# Patient Record
Sex: Male | Born: 1995 | Race: White | Hispanic: No | Smoking: Current every day smoker
Health system: Southern US, Community
[De-identification: ages and names within clinical notes are randomized; demographics above are authoritative.]

## PROBLEM LIST (undated history)

## (undated) HISTORY — PX: WISDOM TOOTH EXTRACTION: SHX21

---
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
## 2016-05-27 ENCOUNTER — Emergency Department
Admission: EM | Admit: 2016-05-27 | Discharge: 2016-05-27 | Disposition: A | Discharge status: Home / Self Care | Payer: BLUE CROSS/BLUE SHIELD | Attending: Emergency Medicine | Admitting: Emergency Medicine

## 2016-05-27 ENCOUNTER — Emergency Department: Payer: BLUE CROSS/BLUE SHIELD

## 2016-05-27 DIAGNOSIS — Y929 Unspecified place or not applicable: Secondary | ICD-10-CM | POA: Diagnosis not present

## 2016-05-27 DIAGNOSIS — Y999 Unspecified external cause status: Secondary | ICD-10-CM | POA: Insufficient documentation

## 2016-05-27 DIAGNOSIS — S838X1A Sprain of other specified parts of right knee, initial encounter: Secondary | ICD-10-CM

## 2016-05-27 DIAGNOSIS — W1839XA Other fall on same level, initial encounter: Secondary | ICD-10-CM | POA: Diagnosis not present

## 2016-05-27 DIAGNOSIS — S83206A Unspecified tear of unspecified meniscus, current injury, right knee, initial encounter: Secondary | ICD-10-CM | POA: Diagnosis not present

## 2016-05-27 DIAGNOSIS — Y9302 Activity, running: Secondary | ICD-10-CM | POA: Diagnosis not present

## 2016-05-27 DIAGNOSIS — F17200 Nicotine dependence, unspecified, uncomplicated: Secondary | ICD-10-CM | POA: Insufficient documentation

## 2016-05-27 DIAGNOSIS — S8991XA Unspecified injury of right lower leg, initial encounter: Secondary | ICD-10-CM | POA: Diagnosis present

## 2016-05-27 NOTE — ED Triage Notes (Signed)
Patient c/o bilateral knee pain. Pt reports he knees locked up and then he fell to the ground.

## 2016-05-27 NOTE — ED Notes (Signed)
Pt reports he was running chasing his dog when he felt a pop in both knees and fell. Pt reports he landed on his right shoulder but it is not bothering him. Pt co pain to bilateral kneed. CMS intact with limited ROMtdue to increasing pain with movement.

## 2016-05-27 NOTE — ED Provider Notes (Signed)
[facility] Emergency Department Provider Note  ____________________________________________  Time seen: Approximately 9:03 PM  I have reviewed the triage vital signs and the nursing notes.   HISTORY  Chief Complaint Knee Injury    HPI Lawrence Stewart is a 21 y.o. male that presents to the emergency room with bilateral knee pain that is worse on the right. Patient states that he was chasing his dog when he heard a pop, which caused him to fall to the ground. Patient states that he fell on right shoulder but is not having any pain in that shoulder. Patient states that he is able to bear weight on his left leg but has a lot of pain with weightbearing on right leg.He has not been able to walk on leg since incident. Pain is primarily with extension of right leg. Patient is not having any difficulty moving ankle or toes. He has never had a knee injury before. Patient denies fever, shortness of breath, chest pain, nausea, vomiting, abdominal pain, numbness, tingling.   History reviewed. No pertinent past medical history.  There are no active problems to display for this patient.   Past Surgical History:  Procedure Laterality Date  . WISDOM TOOTH EXTRACTION      Prior to Admission medications   Not on File    Allergies Patient has no known allergies.  History reviewed. No pertinent family history.  Social History Social History  Substance Use Topics  . Smoking status: Current Every Day Smoker    Packs/day: 1.00  . Smokeless tobacco: Never Used  . Alcohol use Yes     Comment: occassional     Review of Systems  Cardiovascular: No chest pain. Respiratory:  No SOB. Gastrointestinal: No abdominal pain.  No nausea, no vomiting.  Musculoskeletal: Positive for knee pain. Skin: Negative for rash, abrasions, lacerations, ecchymosis. Neurological: Negative for headaches, numbness or tingling   ____________________________________________   PHYSICAL  EXAM:  VITAL SIGNS: ED Triage Vitals [05/27/16 1936]  Enc Vitals Group     BP 133/67     Pulse Rate 80     Resp 17     Temp 98.8 F (37.1 C)     Temp Source Oral     SpO2 98 %     Weight 155 lb (70.3 kg)     Height 5\' 11"  (1.803 m)     Head Circumference      Peak Flow      Pain Score 4     Pain Loc      Pain Edu?      Excl. in GC?      Constitutional: Alert and oriented. Well appearing and in no acute distress. Eyes: Conjunctivae are normal. PERRL. EOMI. Head: Atraumatic. ENT:      Ears:      Nose: No congestion/rhinnorhea.      Mouth/Throat: Mucous membranes are moist.  Neck: No stridor.   Cardiovascular: Normal rate, regular rhythm.  Good peripheral circulation. 2+ dorsalis pedis and posterior tibialis pulses. Respiratory: Normal respiratory effort without tachypnea or retractions. Lungs CTAB. Good air entry to the bases with no decreased or absent breath sounds. Musculoskeletal: Full range of motion to all extremities. No gross deformities appreciated. No tenderness to palpation. No effusion noted. Positive Apley grind and McMurray of right knee. Negative anterior drawer, posterior drawer, valgus, varus, patella apprehension. Neurologic:  Normal speech and language. No gross focal neurologic deficits are appreciated.  Skin:  Skin is warm, dry and intact. No rash noted.  ____________________________________________   LABS (all labs ordered are listed, but only abnormal results are displayed)  Labs Reviewed - No data to display ____________________________________________  EKG   ____________________________________________  RADIOLOGY Lexine Baton, personally viewed and evaluated these images (plain radiographs) as part of my medical decision making, as well as reviewing the written report by the radiologist.  Dg Knee Complete 4 Views Left  Result Date: 05/27/2016 CLINICAL DATA:  Fall during running, bilateral knee pain EXAM: LEFT KNEE - COMPLETE 4+ VIEW  COMPARISON:  None. FINDINGS: Four views of the left knee submitted. No acute fracture or subluxation. No radiopaque foreign body. No joint effusion. No periosteal reaction or bony erosion. IMPRESSION: Negative. Electronically Signed   By: Natasha Mead M.D.   On: 05/27/2016 20:24   Dg Knee Complete 4 Views Right  Result Date: 05/27/2016 CLINICAL DATA:  Pt reports he was running chasing his dog when he felt a pop in both knees and fell. Pt reports he landed on his right shoulder but it is not bothering him. Pt co pain to bilateral kneed. CMS intact with limited ROMtdue to increasing pain with movement. MOST OF HIS PAIN IS BELOW HIS KNEE. EXAM: RIGHT KNEE - COMPLETE 4+ VIEW COMPARISON:  None. FINDINGS: Four views of the right knee submitted. No acute fracture or subluxation. No radiopaque foreign body. No joint effusion. No periosteal reaction or bony erosion. IMPRESSION: Negative. Electronically Signed   By: Natasha Mead M.D.   On: 05/27/2016 20:25    ____________________________________________    PROCEDURES  Procedure(s) performed:    Procedures    Medications - No data to display   ____________________________________________   INITIAL IMPRESSION / ASSESSMENT AND PLAN / ED COURSE  Pertinent labs & imaging results that were available during my care of the patient were reviewed by me and considered in my medical decision making (see chart for details).  Review of the [place] CSRS was performed in accordance of the [facility] prior to dispensing any controlled drugs.     Patient's diagnosis is consistent with meniscal injury. Vital signs and exam are reassuring. No acute bony abnormalities indicated on x-ray. Patient has positive Apley grind and McMurray test on right knee. Knee was Ace wrapped and crutches were given. Patient is to follow up with PCP as directed. Patient is given ED precautions to return to the ED for any worsening or new  symptoms.   ____________________________________________  FINAL CLINICAL IMPRESSION(S) / ED DIAGNOSES  Final diagnoses:  Injury of meniscus of right knee, initial encounter      NEW MEDICATIONS STARTED DURING THIS VISIT:  There are no discharge medications for this patient.       This chart was dictated using voice recognition software/Dragon. Despite best efforts to proofread, errors can occur which can change the meaning. Any change was purely unintentional.    Enid Derry, PA-C 05/27/16 1610    Merrily Brittle, MD 05/28/16 [phone]

## 2017-11-25 IMAGING — DX DG KNEE COMPLETE 4+V*R*
4 series · 4 of 4 positions shown · non-contrast
Comparison: None.

CLINICAL DATA: Pt reports he was running chasing his dog when he
felt a pop in both knees and fell. Pt reports he landed on his right
shoulder but it is not bothering him. Pt co pain to bilateral kneed.
CMS intact with limited ROMtdue to increasing pain with movement.
MOST OF HIS PAIN IS BELOW HIS KNEE.

EXAM:
RIGHT KNEE - COMPLETE 4+ VIEW

[knee ap]
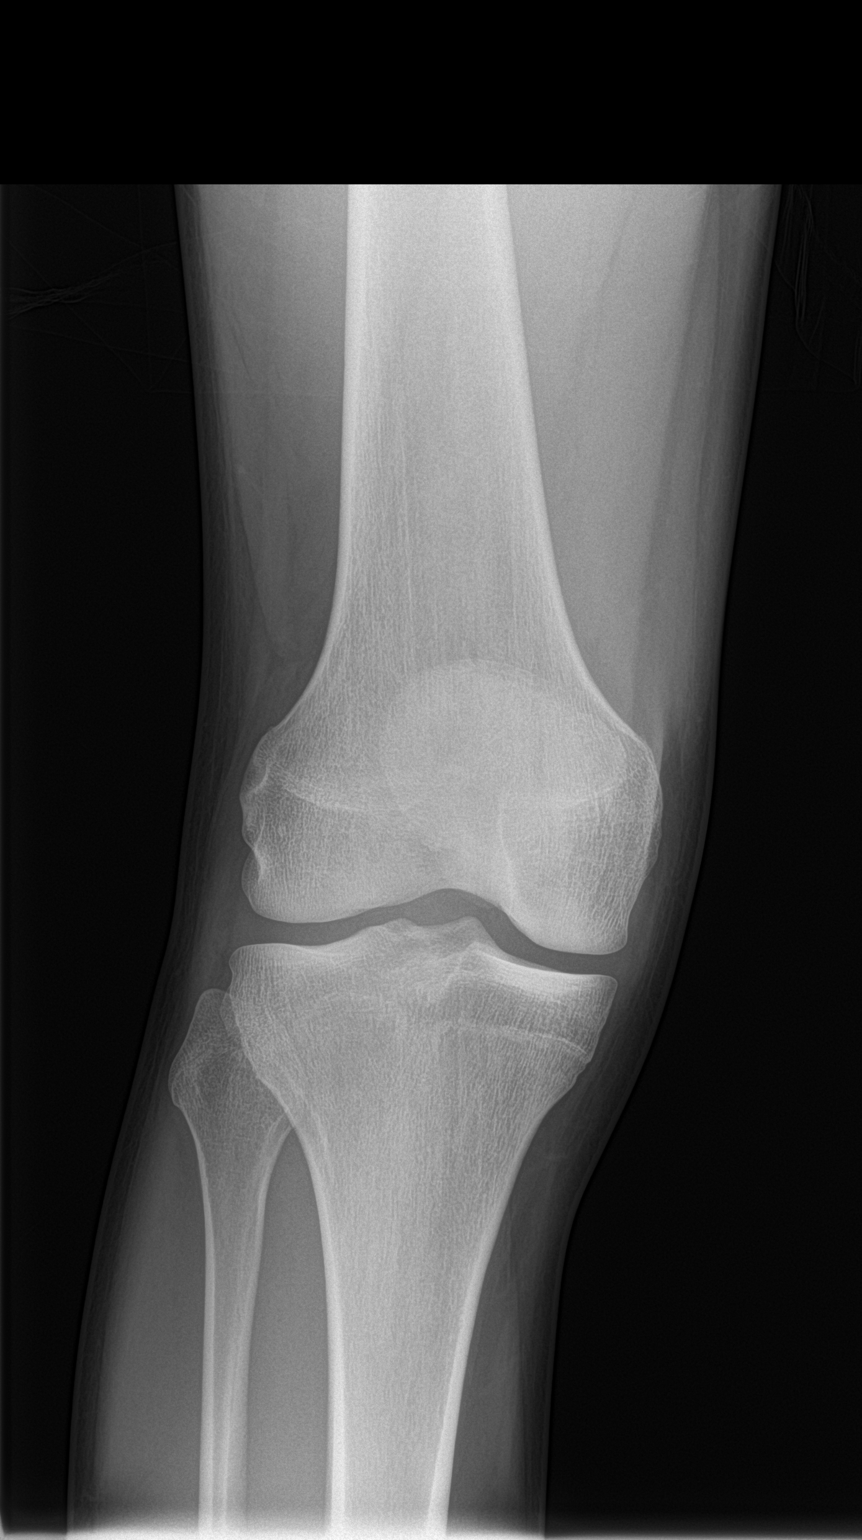

[knee obl (1 of 2)]
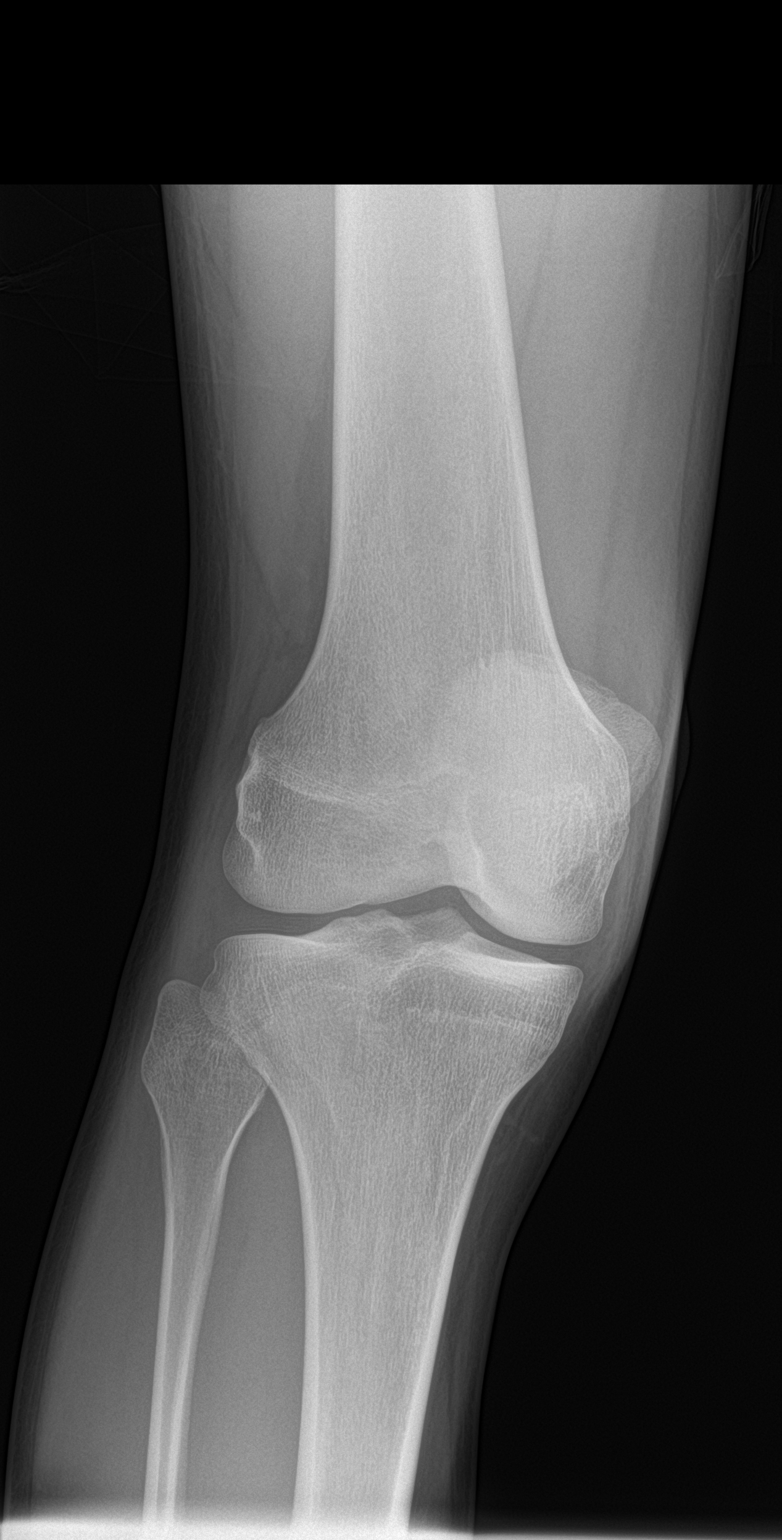

[knee obl (2 of 2)]
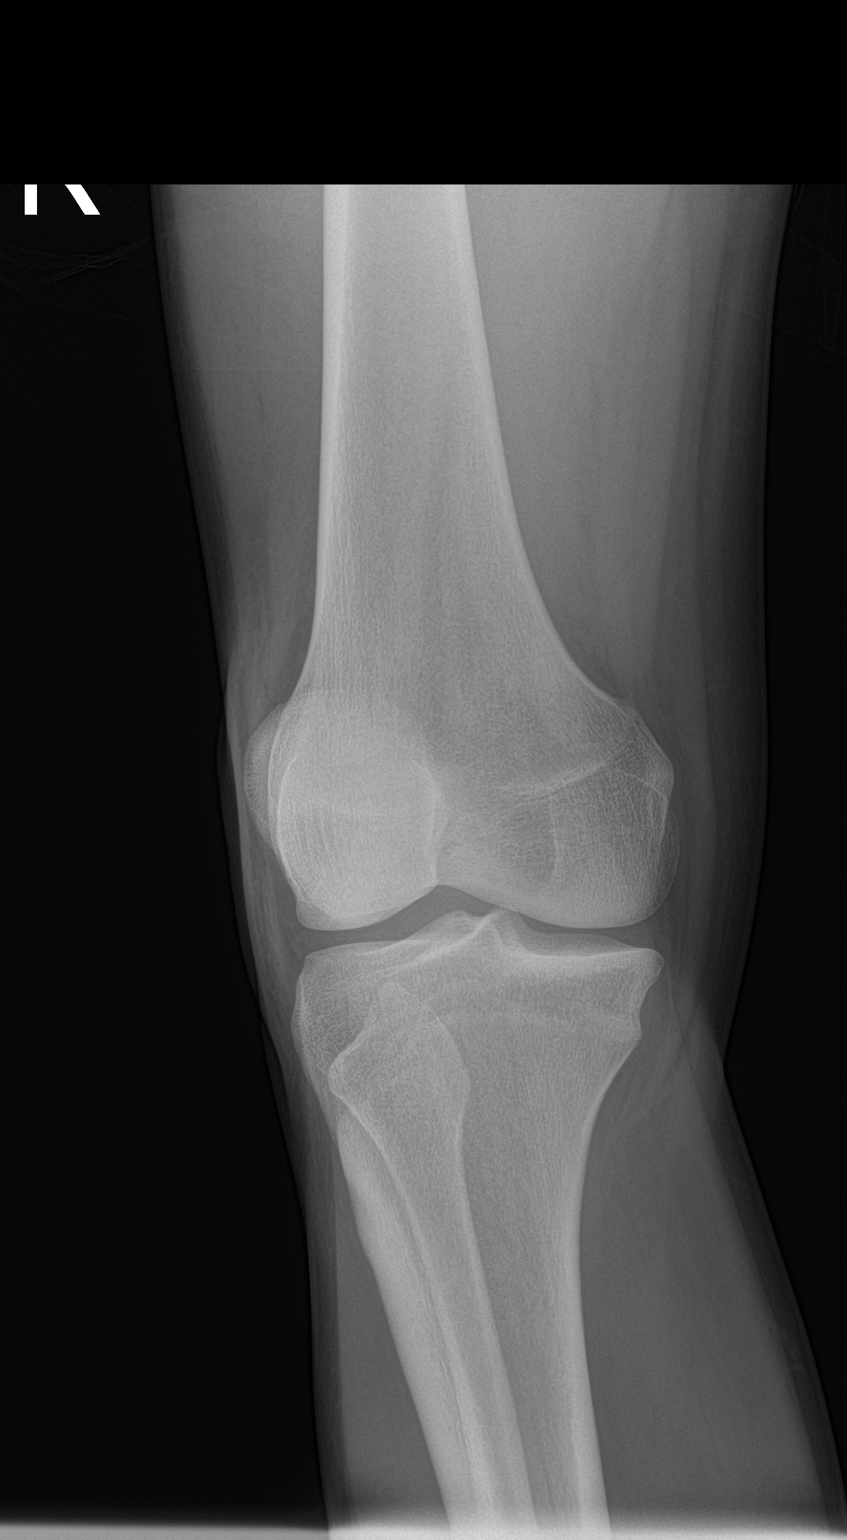

[knee lat]
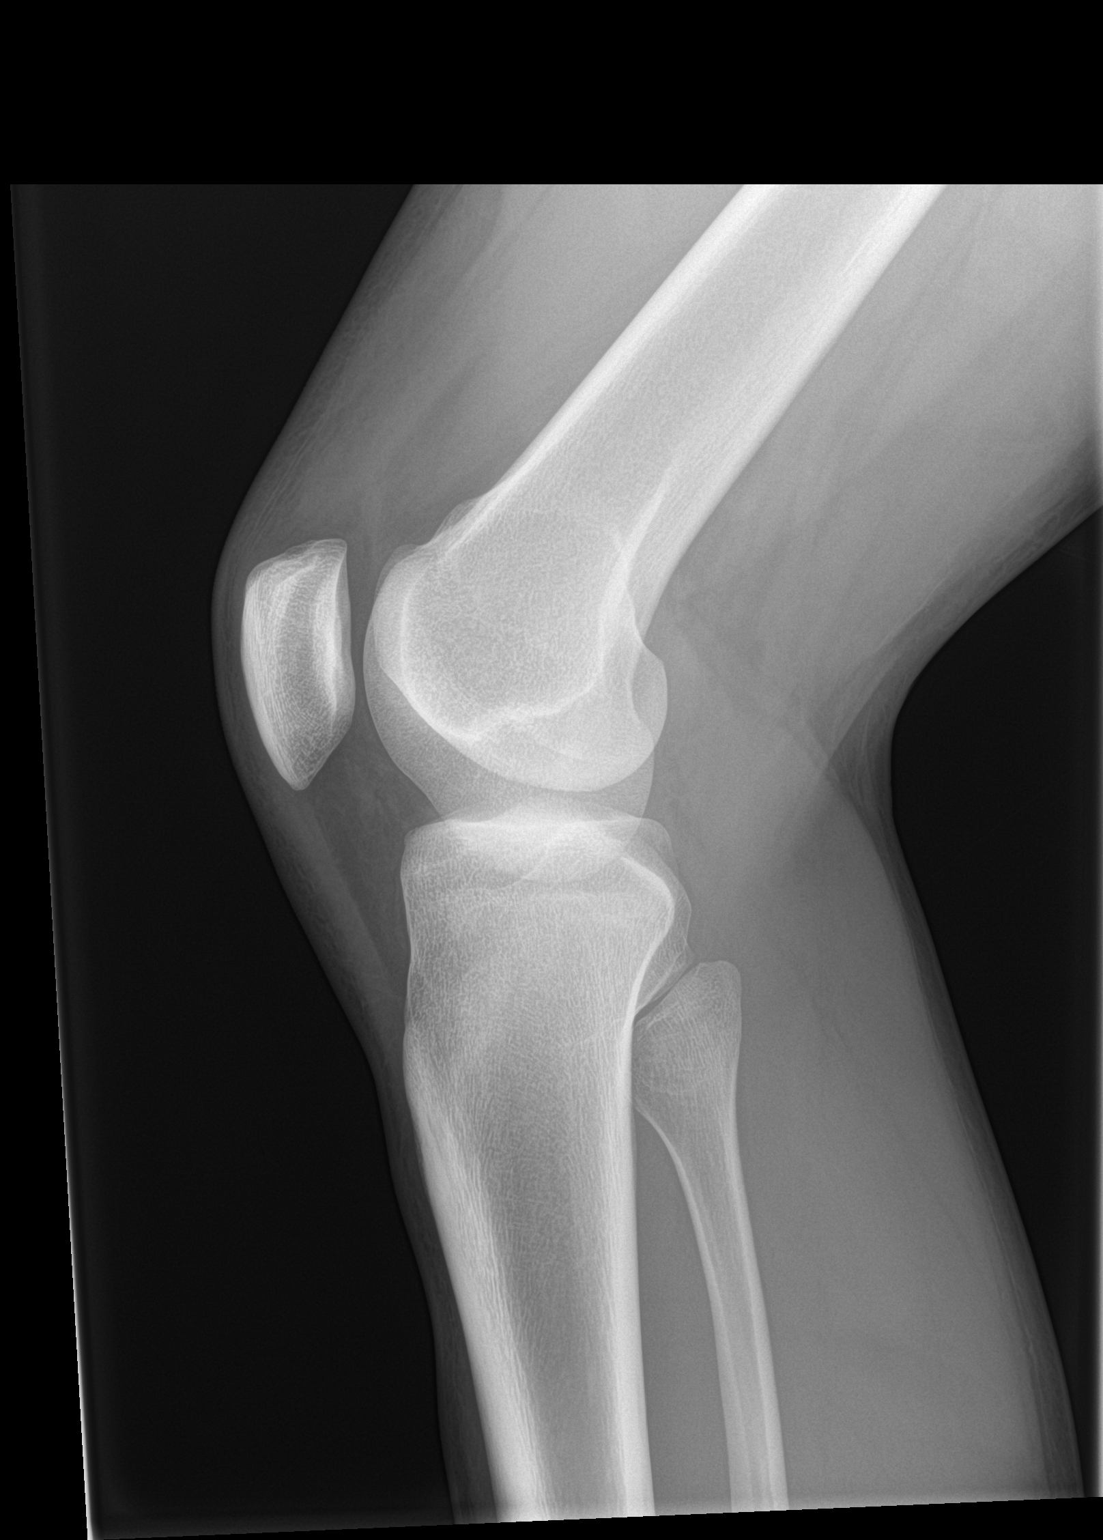

[4 of 4 positions shown; findings below may reference images not displayed]

FINDINGS: Four views of the right knee submitted. No acute fracture or
subluxation. No radiopaque foreign body. No joint effusion. No
periosteal reaction or bony erosion.
IMPRESSION: Negative.

## 2019-04-23 ENCOUNTER — Other Ambulatory Visit: Payer: Self-pay

## 2019-04-23 ENCOUNTER — Emergency Department
Admission: EM | Admit: 2019-04-23 | Discharge: 2019-04-23 | Disposition: A | Discharge status: Home / Self Care | Payer: BLUE CROSS/BLUE SHIELD | Attending: Emergency Medicine | Admitting: Emergency Medicine

## 2019-04-23 ENCOUNTER — Encounter: Payer: Self-pay | Admitting: Emergency Medicine

## 2019-04-23 ENCOUNTER — Emergency Department: Payer: BLUE CROSS/BLUE SHIELD

## 2019-04-23 DIAGNOSIS — Z20822 Contact with and (suspected) exposure to covid-19: Secondary | ICD-10-CM | POA: Diagnosis not present

## 2019-04-23 DIAGNOSIS — R112 Nausea with vomiting, unspecified: Secondary | ICD-10-CM | POA: Diagnosis not present

## 2019-04-23 DIAGNOSIS — F17200 Nicotine dependence, unspecified, uncomplicated: Secondary | ICD-10-CM | POA: Diagnosis not present

## 2019-04-23 DIAGNOSIS — B349 Viral infection, unspecified: Secondary | ICD-10-CM

## 2019-04-23 LAB — COMPREHENSIVE METABOLIC PANEL
ALT: 30 U/L (ref 0–44)
AST: 32 U/L (ref 15–41)
Albumin: 4.8 g/dL (ref 3.5–5.0)
Alkaline Phosphatase: 56 U/L (ref 38–126)
Anion gap: 9 (ref 5–15)
BUN: 21 mg/dL — ABNORMAL HIGH (ref 6–20)
CO2: 26 mmol/L (ref 22–32)
Calcium: 9.7 mg/dL (ref 8.9–10.3)
Chloride: 105 mmol/L (ref 98–111)
Creatinine, Ser: 0.88 mg/dL (ref 0.61–1.24)
GFR calc Af Amer: >60 mL/min (ref >60)
GFR calc non Af Amer: >60 mL/min (ref >60)
Glucose, Bld: 110 mg/dL — ABNORMAL HIGH (ref 70–99)
Potassium: 3.7 mmol/L (ref 3.5–5.1)
Sodium: 140 mmol/L (ref 135–145)
Total Bilirubin: 1.1 mg/dL (ref 0.3–1.2)
Total Protein: 8.1 g/dL (ref 6.5–8.1)

## 2019-04-23 LAB — CBC
HCT: 48.9 % (ref 39.0–52.0)
Hemoglobin: 16.3 g/dL (ref 13.0–17.0)
MCH: 29.5 pg (ref 26.0–34.0)
MCHC: 33.3 g/dL (ref 30.0–36.0)
MCV: 88.6 fL (ref 80.0–100.0)
Platelets: 214 10*3/uL (ref 150–400)
RBC: 5.52 MIL/uL (ref 4.22–5.81)
RDW: 13.1 % (ref 11.5–15.5)
WBC: 16.8 10*3/uL — ABNORMAL HIGH (ref 4.0–10.5)
nRBC: 0 % (ref 0.0–0.2)

## 2019-04-23 LAB — POC SARS CORONAVIRUS 2 AG: SARS Coronavirus 2 Ag: NEGATIVE

## 2019-04-23 MED ORDER — PANTOPRAZOLE SODIUM 40 MG PO TBEC
40.0000 mg | DELAYED_RELEASE_TABLET | Freq: Every day | ORAL | 1 refills | Status: AC
Start: 2019-04-23 — End: 2020-04-22

## 2019-04-23 MED ORDER — ONDANSETRON 4 MG PO TBDP: 4.0000 mg | ORAL_TABLET | Freq: Three times a day (TID) | ORAL | 0 refills | Status: DC | PRN

## 2019-04-23 MED ORDER — SODIUM CHLORIDE 0.9 % IV BOLUS
1000.0000 mL | Freq: Once | INTRAVENOUS | Status: AC
  Administered 2019-04-23: 1000 mL via INTRAVENOUS

## 2019-04-23 MED ORDER — ONDANSETRON HCL 4 MG/2ML IJ SOLN
4.0000 mg | Freq: Once | INTRAMUSCULAR | Status: AC
  Administered 2019-04-23: 4 mg via INTRAVENOUS
  Filled 2019-04-23: qty 2

## 2019-04-23 NOTE — ED Triage Notes (Signed)
Pt arrived via POV stating "I'm sick" pt reports vomiting several times. No distress noted at this time.

## 2019-04-23 NOTE — ED Provider Notes (Signed)
[facility] Emergency Department Provider Note  Time seen: 7:17 AM  I have reviewed the triage vital signs and the nursing notes.   HISTORY  Chief Complaint Emesis   HPI Lawrence Stewart is a 24 y.o. male with no past medical history presents to the emergency department for cough nausea vomiting.  According to the patient for many months now he has been nauseated first thing in the morning often times will vomit in the morning and then feels better.  However he states over the last 3 days he has been experiencing cough as well as nausea and vomiting.  Denies any sick contacts.  Denies any known fever.  Denies any abdominal pain.   History reviewed. No pertinent past medical history.  There are no problems to display for this patient.   Past Surgical History:  Procedure Laterality Date  . WISDOM TOOTH EXTRACTION      Prior to Admission medications   Not on File    No Known Allergies  History reviewed. No pertinent family history.  Social History Social History   Tobacco Use  . Smoking status: Current Every Day Smoker    Packs/day: 1.00  . Smokeless tobacco: Never Used  Substance Use Topics  . Alcohol use: Yes    Comment: occassional  . Drug use: No    Review of Systems Constitutional: Negative for fever. Cardiovascular: Negative for chest pain. Respiratory: Negative for shortness of breath.  Positive for cough. Gastrointestinal: Negative for abdominal pain.  Nausea vomiting mostly in the mornings. Genitourinary: Negative for urinary compaints Musculoskeletal: Negative for musculoskeletal complaints Neurological: Negative for headache All other ROS negative  ____________________________________________   PHYSICAL EXAM:  VITAL SIGNS: ED Triage Vitals  Enc Vitals Group     BP 04/23/19 0657 130/69     Pulse Rate 04/23/19 0657 87     Resp 04/23/19 0657 18     Temp 04/23/19 0657 97.7 F (36.5 C)     Temp Source 04/23/19 0657 Oral    SpO2 04/23/19 0657 98 %     Weight 04/23/19 0654 150 lb (68 kg)     Height 04/23/19 0654 5\' 11"  (1.803 m)     Head Circumference --      Peak Flow --      Pain Score 04/23/19 0654 0     Pain Loc --      Pain Edu? --      Excl. in GC? --    Constitutional: Alert and oriented. Well appearing and in no distress. Eyes: Normal exam ENT      Head: Normocephalic and atraumatic.      Mouth/Throat: Mucous membranes are moist. Cardiovascular: Normal rate, regular rhythm.  Respiratory: Normal respiratory effort without tachypnea nor retractions. Breath sounds are clear Gastrointestinal: Soft and nontender. No distention. Musculoskeletal: Nontender with normal range of motion in all extremities.  Neurologic:  Normal speech and language. No gross focal neurologic deficits  Skin:  Skin is warm, dry and intact.  Psychiatric: Mood and affect are normal.  ____________________________________________   RADIOLOGY  X-ray negative  ____________________________________________   INITIAL IMPRESSION / ASSESSMENT AND PLAN / ED COURSE  Pertinent labs & imaging results that were available during my care of the patient were reviewed by me and considered in my medical decision making (see chart for details).   Patient presents emergency department for nausea vomiting which he states has been an ongoing issue for months mostly in the morning then he feels better, more acutely  over the past 3 days he has been experiencing cough as well as increased nausea vomiting.  Denies any fever.  Reassuring vitals.  Reassuring physical exam.  Patient does have an occasional cough during exam but clear lung sounds.  We will check labs, treat with Zofran, IV fluids.  We will obtain a chest x-ray and a rapid Covid swab.  Patient agreeable to plan of care.  X-rays negative.  Labs are largely reassuring besides a mild leukocytosis.  Reassuring vitals reassuring physical exam.  We will discharge on Protonix as well as  Zofran.  I discussed Maalox use before bedtime.  Patient will be discharged with PCP follow-up as needed.  Discussed return precautions.  Lawrence Stewart was evaluated in Emergency Department on 04/23/2019 for the symptoms described in the history of present illness. He was evaluated in the context of the global COVID-19 pandemic, which necessitated consideration that the patient might be at risk for infection with the SARS-CoV-2 virus that causes COVID-19. Institutional protocols and algorithms that pertain to the evaluation of patients at risk for COVID-19 are in a state of rapid change based on information released by regulatory bodies including the CDC and federal and state organizations. These policies and algorithms were followed during the patient's care in the ED.  ____________________________________________   FINAL CLINICAL IMPRESSION(S) / ED DIAGNOSES  Nausea vomiting   Harvest Dark, MD 04/23/19 [phone]

## 2020-10-21 IMAGING — DX DG CHEST 1V PORT
1 series · 1 of 1 positions shown · non-contrast
Comparison: October 12, 2010

CLINICAL DATA: Cough

EXAM:
PORTABLE CHEST 1 VIEW

[chest ap]
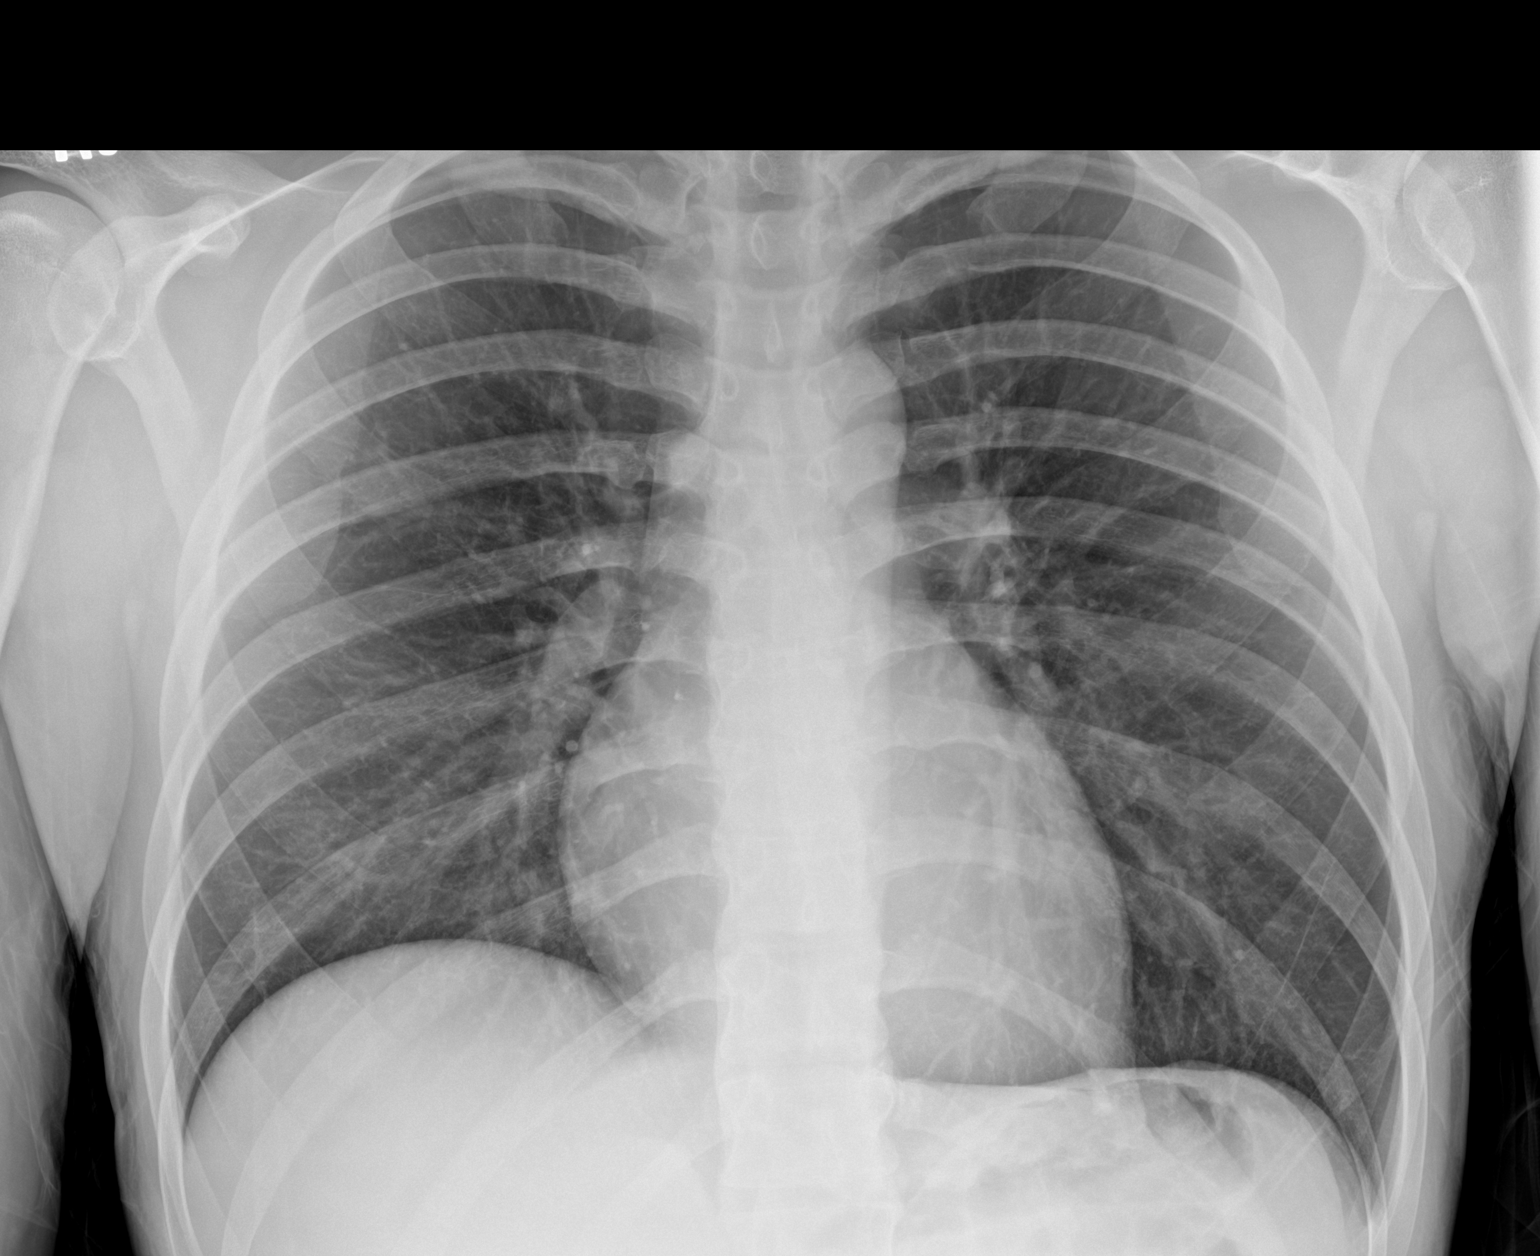

[1 of 1 positions shown; findings below may reference images not displayed]

FINDINGS: Lungs are clear. Heart size and pulmonary vascularity are normal. No
adenopathy. No bone lesions.
IMPRESSION: No abnormality noted.

## 2021-07-27 ENCOUNTER — Emergency Department (HOSPITAL_BASED_OUTPATIENT_CLINIC_OR_DEPARTMENT_OTHER)
Admission: EM | Admit: 2021-07-27 | Discharge: 2021-07-27 | Disposition: A | Discharge status: Home / Self Care | Payer: BLUE CROSS/BLUE SHIELD | Attending: Emergency Medicine | Admitting: Emergency Medicine

## 2021-07-27 ENCOUNTER — Other Ambulatory Visit: Payer: Self-pay

## 2021-07-27 ENCOUNTER — Encounter (HOSPITAL_BASED_OUTPATIENT_CLINIC_OR_DEPARTMENT_OTHER): Payer: Self-pay

## 2021-07-27 ENCOUNTER — Other Ambulatory Visit (HOSPITAL_BASED_OUTPATIENT_CLINIC_OR_DEPARTMENT_OTHER): Payer: Self-pay

## 2021-07-27 DIAGNOSIS — R112 Nausea with vomiting, unspecified: Secondary | ICD-10-CM

## 2021-07-27 DIAGNOSIS — K922 Gastrointestinal hemorrhage, unspecified: Secondary | ICD-10-CM | POA: Insufficient documentation

## 2021-07-27 LAB — COMPREHENSIVE METABOLIC PANEL
ALT: 18 U/L (ref 0–44)
AST: 17 U/L (ref 15–41)
Albumin: 4.6 g/dL (ref 3.5–5.0)
Alkaline Phosphatase: 42 U/L (ref 38–126)
Anion gap: 13 (ref 5–15)
BUN: 24 mg/dL — ABNORMAL HIGH (ref 6–20)
CO2: 23 mmol/L (ref 22–32)
Calcium: 9.3 mg/dL (ref 8.9–10.3)
Chloride: 100 mmol/L (ref 98–111)
Creatinine, Ser: 0.8 mg/dL (ref 0.61–1.24)
GFR, Estimated: >60 mL/min (ref >60)
Glucose, Bld: 87 mg/dL (ref 70–99)
Potassium: 3.6 mmol/L (ref 3.5–5.1)
Sodium: 136 mmol/L (ref 135–145)
Total Bilirubin: 0.5 mg/dL (ref 0.3–1.2)
Total Protein: 7.4 g/dL (ref 6.5–8.1)

## 2021-07-27 LAB — URINALYSIS, ROUTINE W REFLEX MICROSCOPIC
Bilirubin Urine: NEGATIVE
Glucose, UA: NEGATIVE mg/dL
Ketones, ur: >80 mg/dL — AB
Leukocytes,Ua: NEGATIVE
Nitrite: NEGATIVE
Protein, ur: 100 mg/dL — AB
Specific Gravity, Urine: 1.036 — ABNORMAL HIGH (ref 1.005–1.030)
pH: 6 (ref 5.0–8.0)

## 2021-07-27 LAB — CBC
HCT: 47.9 % (ref 39.0–52.0)
Hemoglobin: 16.2 g/dL (ref 13.0–17.0)
MCH: 29.5 pg (ref 26.0–34.0)
MCHC: 33.8 g/dL (ref 30.0–36.0)
MCV: 87.1 fL (ref 80.0–100.0)
Platelets: 150 10*3/uL (ref 150–400)
RBC: 5.5 MIL/uL (ref 4.22–5.81)
RDW: 13.1 % (ref 11.5–15.5)
WBC: 5.8 10*3/uL (ref 4.0–10.5)
nRBC: 0 % (ref 0.0–0.2)

## 2021-07-27 LAB — LIPASE, BLOOD: Lipase: 24 U/L (ref 11–51)

## 2021-07-27 MED ORDER — PANTOPRAZOLE SODIUM 40 MG IV SOLR
40.0000 mg | Freq: Once | INTRAVENOUS | Status: AC
  Administered 2021-07-27: 40 mg via INTRAVENOUS
  Filled 2021-07-27: qty 10

## 2021-07-27 MED ORDER — ONDANSETRON 4 MG PO TBDP
4.0000 mg | ORAL_TABLET | Freq: Three times a day (TID) | ORAL | 0 refills | Status: AC | PRN
  Filled 2021-07-27: qty 20, 7d supply, fill #0

## 2021-07-27 MED ORDER — OMEPRAZOLE 20 MG PO CPDR
20.0000 mg | DELAYED_RELEASE_CAPSULE | Freq: Every day | ORAL | 0 refills | Status: AC
  Filled 2021-07-27: qty 30, 30d supply, fill #0

## 2021-07-27 MED ORDER — SODIUM CHLORIDE 0.9 % IV BOLUS
1000.0000 mL | Freq: Once | INTRAVENOUS | Status: AC
  Administered 2021-07-27: 1000 mL via INTRAVENOUS

## 2021-07-27 NOTE — Discharge Instructions (Signed)
Do not take anymore aspirin or ibuprofen/Motrin or Aleve.  Tylenol would be okay to take as needed.  Take the medications as written today.  Follow-up with gastroenterologist by calling to make an appointment this week.  Return back to the ER if your symptoms worsen if you have fevers heavy bleeding or any additional concerns return immediately back to the ER.

## 2021-07-27 NOTE — ED Triage Notes (Signed)
He reports nausea, lack of appetite x 4 days. He states he vomited x 2 "and the second time had a little blood in it" [sic]. He knows two other people with similar s/s.

## 2021-07-27 NOTE — ED Provider Notes (Signed)
[facility] EMERGENCY DEPT Provider Note   CSN: [id number] Arrival date & time: 07/27/21  1028     History  Chief Complaint  Patient presents with   Emesis    Lawrence Stewart is a 26 y.o. male.  Patient presents to ER chief complaint of nausea vomiting x4 days.  He states he had contact with a friend who had similar symptoms about 4 days ago.  Otherwise denies any headache denies any diarrhea.  He is noticed streaks of blood in his vomitus yesterday and today.  Denies any recent travel or recent antibiotic use.  He took Zofran from a friend today.  Patient also states that for the past 3 days he has been taking ibuprofen for body aches.      Home Medications Prior to Admission medications   Medication Sig Start Date End Date Taking? Authorizing Provider  omeprazole (PRILOSEC) 20 MG capsule Take 1 capsule (20 mg total) by mouth daily. 07/27/21 08/26/21 Yes Cheryll Cockayne, MD  ondansetron (ZOFRAN-ODT) 4 MG disintegrating tablet Take 1 tablet (4 mg total) by mouth every 8 (eight) hours as needed for nausea or vomiting. 07/27/21  Yes Cheryll Cockayne, MD  pantoprazole (PROTONIX) 40 MG tablet Take 1 tablet (40 mg total) by mouth daily. 04/23/19 04/22/20  Minna Antis, MD      Allergies    Patient has no known allergies.    Review of Systems   Review of Systems  Constitutional:  Negative for fever.  HENT:  Negative for ear pain and sore throat.   Eyes:  Negative for pain.  Respiratory:  Negative for cough.   Cardiovascular:  Negative for chest pain.  Gastrointestinal:  Negative for abdominal pain.  Genitourinary:  Negative for flank pain.  Musculoskeletal:  Negative for back pain.  Skin:  Negative for color change and rash.  Neurological:  Negative for syncope.  All other systems reviewed and are negative.  Physical Exam Updated Vital Signs BP 119/65   Pulse (!) 49   Temp 97.9 F (36.6 C) (Oral)   Resp 18   Ht 5\' 10"  (1.778 m)   Wt 72.6 kg   SpO2 100%    BMI 22.96 kg/m  Physical Exam Constitutional:      Appearance: He is well-developed.  HENT:     Head: Normocephalic.     Nose: Nose normal.  Eyes:     Extraocular Movements: Extraocular movements intact.  Cardiovascular:     Rate and Rhythm: Normal rate.  Pulmonary:     Effort: Pulmonary effort is normal.  Abdominal:     Tenderness: There is no abdominal tenderness. There is no guarding or rebound.  Skin:    Coloration: Skin is not jaundiced.  Neurological:     General: No focal deficit present.     Mental Status: He is alert and oriented to person, place, and time. Mental status is at baseline.    ED Results / Procedures / Treatments   Labs (all labs ordered are listed, but only abnormal results are displayed) Labs Reviewed  COMPREHENSIVE METABOLIC PANEL - Abnormal; Notable for the following components:      Result Value   BUN 24 (*)    All other components within normal limits  URINALYSIS, ROUTINE W REFLEX MICROSCOPIC - Abnormal; Notable for the following components:   Specific Gravity, Urine 1.036 (*)    Hgb urine dipstick MODERATE (*)    Ketones, ur >80 (*)    Protein, ur 100 (*)  All other components within normal limits  LIPASE, BLOOD  CBC    EKG None  Radiology No results found.  Procedures Procedures    Medications Ordered in ED Medications  pantoprazole (PROTONIX) injection 40 mg (40 mg Intravenous Given 07/27/21 1139)  sodium chloride 0.9 % bolus 1,000 mL (0 mLs Intravenous Stopped 07/27/21 1250)    ED Course/ Medical Decision Making/ A&P                           Medical Decision Making Amount and/or Complexity of Data Reviewed Labs: ordered.  Risk Prescription drug management.   Chart review shows episode of nausea vomiting February 2021.  Cardiac monitoring shows sinus rhythm.  Diagnostic studies included labs.  Patient had normal chemistry normal white count normal hemoglobin of 16.  Given liter bolus of IV fluids and IV  Protonix.  Will prescribe omeprazole to go home with.  Recommending outpatient follow-up with GI.  Advised patient to call tomorrow to schedule an appointment.  Recommend immediate return for worsening symptoms or any additional concerns.  Advised him not to take any more ibuprofen or aspirin or Aleve.        Final Clinical Impression(s) / ED Diagnoses Final diagnoses:  Nausea and vomiting, unspecified vomiting type  Upper GI bleed    Rx / DC Orders ED Discharge Orders          Ordered    omeprazole (PRILOSEC) 20 MG capsule  Daily        07/27/21 1310    ondansetron (ZOFRAN-ODT) 4 MG disintegrating tablet  Every 8 hours PRN        07/27/21 1310              Cheryll Cockayne, MD 07/27/21 1311

## 2023-06-19 ENCOUNTER — Other Ambulatory Visit: Payer: Self-pay

## 2023-06-19 ENCOUNTER — Emergency Department
Admission: EM | Admit: 2023-06-19 | Discharge: 2023-06-19 | Disposition: A | Discharge status: Home / Self Care | Payer: Self-pay | Attending: Emergency Medicine | Admitting: Emergency Medicine

## 2023-06-19 ENCOUNTER — Emergency Department: Payer: Self-pay

## 2023-06-19 DIAGNOSIS — R197 Diarrhea, unspecified: Secondary | ICD-10-CM | POA: Diagnosis not present

## 2023-06-19 DIAGNOSIS — R059 Cough, unspecified: Secondary | ICD-10-CM | POA: Insufficient documentation

## 2023-06-19 DIAGNOSIS — R112 Nausea with vomiting, unspecified: Secondary | ICD-10-CM | POA: Diagnosis present

## 2023-06-19 LAB — COMPREHENSIVE METABOLIC PANEL WITH GFR
ALT: 26 U/L (ref 0–44)
AST: 22 U/L (ref 15–41)
Albumin: 5 g/dL (ref 3.5–5.0)
Alkaline Phosphatase: 46 U/L (ref 38–126)
Anion gap: 12 (ref 5–15)
BUN: 37 mg/dL — ABNORMAL HIGH (ref 6–20)
CO2: 21 mmol/L — ABNORMAL LOW (ref 22–32)
Calcium: 9.8 mg/dL (ref 8.9–10.3)
Chloride: 100 mmol/L (ref 98–111)
Creatinine, Ser: 0.97 mg/dL (ref 0.61–1.24)
GFR, Estimated: >60 mL/min (ref >60)
Glucose, Bld: 101 mg/dL — ABNORMAL HIGH (ref 70–99)
Potassium: 3.9 mmol/L (ref 3.5–5.1)
Sodium: 133 mmol/L — ABNORMAL LOW (ref 135–145)
Total Bilirubin: 0.6 mg/dL (ref 0.0–1.2)
Total Protein: 8.1 g/dL (ref 6.5–8.1)

## 2023-06-19 LAB — CBC
HCT: 51.3 % (ref 39.0–52.0)
Hemoglobin: 17.4 g/dL — ABNORMAL HIGH (ref 13.0–17.0)
MCH: 29.7 pg (ref 26.0–34.0)
MCHC: 33.9 g/dL (ref 30.0–36.0)
MCV: 87.7 fL (ref 80.0–100.0)
Platelets: 232 10*3/uL (ref 150–400)
RBC: 5.85 MIL/uL — ABNORMAL HIGH (ref 4.22–5.81)
RDW: 12.5 % (ref 11.5–15.5)
WBC: 9.9 10*3/uL (ref 4.0–10.5)
nRBC: 0 % (ref 0.0–0.2)

## 2023-06-19 LAB — RESP PANEL BY RT-PCR (RSV, FLU A&B, COVID)  RVPGX2
Influenza A by PCR: NEGATIVE
Influenza B by PCR: NEGATIVE
Resp Syncytial Virus by PCR: NEGATIVE
SARS Coronavirus 2 by RT PCR: NEGATIVE

## 2023-06-19 LAB — LIPASE, BLOOD: Lipase: 33 U/L (ref 11–51)

## 2023-06-19 MED ORDER — ONDANSETRON HCL 4 MG/2ML IJ SOLN
4.0000 mg | Freq: Once | INTRAMUSCULAR | Status: AC
  Administered 2023-06-19: 4 mg via INTRAVENOUS
  Filled 2023-06-19: qty 2

## 2023-06-19 MED ORDER — SODIUM CHLORIDE 0.9 % IV BOLUS
1000.0000 mL | Freq: Once | INTRAVENOUS | Status: AC
  Administered 2023-06-19: 1000 mL via INTRAVENOUS

## 2023-06-19 MED ORDER — ONDANSETRON HCL 4 MG PO TABS: 4.0000 mg | ORAL_TABLET | Freq: Three times a day (TID) | ORAL | 0 refills | Status: AC | PRN

## 2023-06-19 MED ORDER — DROPERIDOL 2.5 MG/ML IJ SOLN
1.2500 mg | Freq: Once | INTRAMUSCULAR | Status: AC
  Administered 2023-06-19: 1.25 mg via INTRAVENOUS
  Filled 2023-06-19: qty 2

## 2023-06-19 NOTE — ED Notes (Signed)
Pt given sprite to drink. 

## 2023-06-19 NOTE — ED Provider Notes (Signed)
 [facility] Provider Note    Event Date/Time   First MD Initiated Contact with Patient 06/19/23 1136     (approximate)   History   Hematemesis   HPI Lawrence Stewart is a 28 y.o. male presented today for vomiting.  Patient states over the past 3 days he has had continuous nausea, vomiting, and diarrhea.  No specific abdominal pain.  Has had some mild congestion and cough as well.  Denies any specific chest pain or shortness of breath or dysuria.  He stated he had an episode today where he saw slight streaks of blood which she had not seen before.  Denies any frank blood in his vomit or in his stool.  No black stools.  No prior history of GI bleed.     Physical Exam   Triage Vital Signs: ED Triage Vitals [06/19/23 1123]  Encounter Vitals Group     BP 127/89     Systolic BP Percentile      Diastolic BP Percentile      Pulse Rate 66     Resp 20     Temp 98.1 F (36.7 C)     Temp Source Oral     SpO2 98 %     Weight      Height      Head Circumference      Peak Flow      Pain Score 0     Pain Loc      Pain Education      Exclude from Growth Chart     Most recent vital signs: Vitals:   06/19/23 1445 06/19/23 1511  BP: 126/65 (!) 134/98  Pulse: (!) 44 62  Resp: 20 18  Temp:    SpO2: 99% 99%   Physical Exam: I have reviewed the vital signs and nursing notes. General: Awake, alert, no acute distress.  Nontoxic appearing. Head:  Atraumatic, normocephalic.   ENT:  EOM intact, PERRL. Oral mucosa is pink and moist with no lesions. Neck: Neck is supple with full range of motion, No meningeal signs. Cardiovascular:  RRR, No murmurs. Peripheral pulses palpable and equal bilaterally. Respiratory:  Symmetrical chest wall expansion.  No rhonchi, rales, or wheezes.  Good air movement throughout.  No use of accessory muscles.   Musculoskeletal:  No cyanosis or edema. Moving extremities with full ROM Abdomen:  Soft, nontender, nondistended. Neuro:  GCS  15, moving all four extremities, interacting appropriately. Speech clear. Psych:  Calm, appropriate.   Skin:  Warm, dry, no rash.    ED Results / Procedures / Treatments   Labs (all labs ordered are listed, but only abnormal results are displayed) Labs Reviewed  COMPREHENSIVE METABOLIC PANEL WITH GFR - Abnormal; Notable for the following components:      Result Value   Sodium 133 (*)    CO2 21 (*)    Glucose, Bld 101 (*)    BUN 37 (*)    All other components within normal limits  CBC - Abnormal; Notable for the following components:   RBC 5.85 (*)    Hemoglobin 17.4 (*)    All other components within normal limits  RESP PANEL BY RT-PCR (RSV, FLU A&B, COVID)  RVPGX2  LIPASE, BLOOD     EKG    RADIOLOGY Independently interpreted chest x-ray with no acute pathology   PROCEDURES:  Critical Care performed: No  Procedures   MEDICATIONS ORDERED IN ED: Medications  ondansetron (ZOFRAN) injection 4 mg (4 mg Intravenous Given  06/19/23 1250)  sodium chloride 0.9 % bolus 1,000 mL (0 mLs Intravenous Stopped 06/19/23 1348)  droperidol (INAPSINE) 2.5 MG/ML injection 1.25 mg (1.25 mg Intravenous Given 06/19/23 1412)     IMPRESSION / MDM / ASSESSMENT AND PLAN / ED COURSE  I reviewed the triage vital signs and the nursing notes.                              Differential diagnosis includes, but is not limited to, viral gastroenteritis, Mallory-Weiss tear, viral URI, COVID, flu, RSV, dehydration  Patient's presentation is most consistent with acute complicated illness / injury requiring diagnostic workup.  Patient is a 28 year old male presenting today for nausea, vomiting, and diarrhea.  Had 1 episode of some streaks of blood in his vomit but no frank blood in the vomit.  Given 3 days of vomiting, suspect small microtears consistent with a Mallory-Weiss injury.  Although, no frank blood loss.  He is well-appearing on exam with no abdominal tenderness likely indicating more viral GI  infection versus gastroenteritis versus colitis.  Will treat with nausea medication and fluids.  CBC reassuring.  Hemoglobin slightly elevated likely indicating some hemoconcentration.  Lipase and CMP otherwise reassuring outside of signs of dehydration.  Chest x-ray shows no acute pathology per my interpretation.  Significant delay in radiology reads and I do not think he needs to wait any longer for that as he has no tachypnea, shortness of breath, hypoxia on room air.  Negative for COVID, flu, RSV.  Was feeling better following nausea medication and fluids.  Will discharge with Zofran and given strict return precautions for any worsening abdominal pain.  The patient is on the cardiac monitor to evaluate for evidence of arrhythmia and/or significant heart rate changes.     FINAL CLINICAL IMPRESSION(S) / ED DIAGNOSES   Final diagnoses:  Nausea vomiting and diarrhea     Rx / DC Orders   ED Discharge Orders          Ordered    ondansetron (ZOFRAN) 4 MG tablet  Every 8 hours PRN        06/19/23 1514             Note:  This document was prepared using Dragon voice recognition software and may include unintentional dictation errors.   Kandee Orion, MD 06/19/23 [phone]

## 2023-06-19 NOTE — ED Notes (Signed)
 Pt heart rate low when sleeping, once awake and talking HR increases into the 70's.

## 2023-06-19 NOTE — ED Notes (Signed)
 Pt stating he still feels nauseated after zofran. Pt only drinking a few sips from drink so far. Has not vomited since arrival to room.

## 2023-06-19 NOTE — ED Triage Notes (Signed)
 Pt to ED via POV from home. Pt reports has been sick for 3 days and this morning had streaks of blood in his vomit. Pt also reports diarrhea and abd pain. Pt denies etoh use or drug use. Pt reports has not urinated in 3 days

## 2023-06-19 NOTE — ED Notes (Signed)
 Pt asleep upon entering room, pt reconnected to monitor.

## 2023-06-19 NOTE — ED Notes (Signed)
 Pt stating still feeling nauseated. Last time pt vomitted was about an hour ago but states has not had anything to drink today so not much is coming out. Pt endorses small amount of bright red blood in emesis. Denies any pain at this time.

## 2023-06-19 NOTE — Discharge Instructions (Signed)
 I have sent nausea medication to your pharmacy.  Please follow-up with your primary care provider as needed.  Please return for any severe worsening symptoms.  Hydration is the biggest key at this time.

## 2023-06-30 ENCOUNTER — Encounter (HOSPITAL_BASED_OUTPATIENT_CLINIC_OR_DEPARTMENT_OTHER): Payer: Self-pay

## 2023-06-30 ENCOUNTER — Other Ambulatory Visit: Payer: Self-pay

## 2023-06-30 DIAGNOSIS — J029 Acute pharyngitis, unspecified: Secondary | ICD-10-CM | POA: Diagnosis present

## 2023-06-30 DIAGNOSIS — J36 Peritonsillar abscess: Secondary | ICD-10-CM | POA: Insufficient documentation

## 2023-06-30 MED ORDER — SODIUM CHLORIDE 0.9 % IV SOLN
3.0000 g | Freq: Once | INTRAVENOUS | Status: AC
  Administered 2023-07-01: 3 g via INTRAVENOUS

## 2023-06-30 MED ORDER — DEXAMETHASONE SODIUM PHOSPHATE 10 MG/ML IJ SOLN
10.0000 mg | Freq: Once | INTRAMUSCULAR | Status: AC
  Administered 2023-07-01: 10 mg via INTRAVENOUS
  Filled 2023-06-30: qty 1

## 2023-06-30 NOTE — ED Triage Notes (Signed)
 Pt presents via POV c/o sore throat and "swollen throat" x2 days. Report worse today. Reports painful swallowing.

## 2023-07-01 ENCOUNTER — Emergency Department (HOSPITAL_BASED_OUTPATIENT_CLINIC_OR_DEPARTMENT_OTHER): Payer: Self-pay

## 2023-07-01 ENCOUNTER — Emergency Department (HOSPITAL_BASED_OUTPATIENT_CLINIC_OR_DEPARTMENT_OTHER)
Admission: EM | Admit: 2023-07-01 | Discharge: 2023-07-01 | Disposition: A | Discharge status: Home / Self Care | Payer: Self-pay | Attending: Emergency Medicine | Admitting: Emergency Medicine

## 2023-07-01 DIAGNOSIS — J36 Peritonsillar abscess: Secondary | ICD-10-CM

## 2023-07-01 LAB — GROUP A STREP BY PCR: Group A Strep by PCR: NOT DETECTED

## 2023-07-01 MED ORDER — KETOROLAC TROMETHAMINE 15 MG/ML IJ SOLN
15.0000 mg | Freq: Once | INTRAMUSCULAR | Status: AC
Start: 2023-07-01 — End: 2023-07-01
  Administered 2023-07-01: 15 mg via INTRAVENOUS
  Filled 2023-07-01: qty 1

## 2023-07-01 MED ORDER — HYDROCODONE-ACETAMINOPHEN 7.5-325 MG/15ML PO SOLN: 15.0000 mL | Freq: Four times a day (QID) | ORAL | 0 refills | Status: AC | PRN

## 2023-07-01 MED ORDER — IOHEXOL 300 MG/ML  SOLN
100.0000 mL | Freq: Once | INTRAMUSCULAR | Status: AC | PRN
  Administered 2023-07-01: 75 mL via INTRAVENOUS

## 2023-07-01 MED ORDER — AMOXICILLIN-POT CLAVULANATE 400-57 MG/5ML PO SUSR: 875.0000 mg | Freq: Two times a day (BID) | ORAL | 0 refills | Status: AC

## 2023-07-01 MED ORDER — SODIUM CHLORIDE 0.9 % IV BOLUS
1000.0000 mL | Freq: Once | INTRAVENOUS | Status: AC
  Administered 2023-07-01: 1000 mL via INTRAVENOUS

## 2023-07-01 NOTE — ED Notes (Signed)
Pt tolerating sips of water at this time.

## 2023-07-01 NOTE — ED Provider Notes (Signed)
 [place] EMERGENCY DEPARTMENT AT [facility] Provider Note   CSN: [id number] Arrival date & time: 06/30/23  2330     History  Chief Complaint  Patient presents with   Sore Throat    Lawrence Stewart is a 28 y.o. male.  The history is provided by the patient.  Sore Throat This is a new problem. The current episode started 2 days ago. The problem has been gradually worsening. The symptoms are aggravated by swallowing. Nothing relieves the symptoms.   Patient reports onset of sore throat and feels swollen for the past 2 days.  Reports painful swallowing. No fevers or vomiting in the past 24 hours.  Did have a viral gastrointestinal illness over 10 days ago that resolved    Home Medications Prior to Admission medications   Medication Sig Start Date End Date Taking? Authorizing Provider  amoxicillin-clavulanate (AUGMENTIN) 400-57 MG/5ML suspension Take 10.9 mLs (875 mg total) by mouth 2 (two) times daily for 7 days. 07/01/23 07/08/23 Yes Eldon Greenland, MD  HYDROcodone-acetaminophen  (HYCET) 7.5-325 mg/15 ml solution Take 15 mLs by mouth every 6 (six) hours as needed for severe pain (pain score 7-10). 07/01/23 06/30/24 Yes Eldon Greenland, MD  omeprazole  (PRILOSEC) 20 MG capsule Take 1 capsule (20 mg total) by mouth daily. 07/27/21 08/26/21  Billie Budge, MD  ondansetron  (ZOFRAN ) 4 MG tablet Take 1 tablet (4 mg total) by mouth every 8 (eight) hours as needed. 06/19/23 06/18/24  Kandee Orion, MD  ondansetron  (ZOFRAN -ODT) 4 MG disintegrating tablet Take 1 tablet (4 mg total) by mouth every 8 (eight) hours as needed for nausea or vomiting. 07/27/21   Billie Budge, MD  pantoprazole  (PROTONIX ) 40 MG tablet Take 1 tablet (40 mg total) by mouth daily. 04/23/19 04/22/20  Paduchowski, Kevin, MD      Allergies    Patient has no known allergies.    Review of Systems   Review of Systems  Constitutional:  Negative for fever.  HENT:  Positive for sore throat.     Physical  Exam Updated Vital Signs BP 117/73   Pulse 95   Temp 98.7 F (37.1 C) (Oral)   Resp 16   SpO2 95%  Physical Exam CONSTITUTIONAL: Well developed/well nourished, no distress HEAD: Normocephalic/atraumatic EYES: EOMI/PERRL ENMT: Mucous membranes moist, uvula is deviated to the right.  Erythema and edema noted to the left tonsillar region.  No stridor, no drooling, no trismus.  No dysphonia NECK: supple no meningeal signs LUNGS: no apparent distress NEURO: Pt is awake/alert/appropriate, moves all extremitiesx4.  No facial droop.   EXTREMITIES:  full ROM SKIN: warm, color normal   ED Results / Procedures / Treatments   Labs (all labs ordered are listed, but only abnormal results are displayed) Labs Reviewed  GROUP A STREP BY PCR    EKG None  Radiology CT Soft Tissue Neck W Contrast Result Date: 07/01/2023 CLINICAL DATA:  Initial evaluation for acute sore throat, suspected epiglottitis or tonsillitis. EXAM: CT NECK WITH CONTRAST TECHNIQUE: Multidetector CT imaging of the neck was performed using the standard protocol following the bolus administration of intravenous contrast. RADIATION DOSE REDUCTION: This exam was performed according to the departmental dose-optimization program which includes automated exposure control, adjustment of the mA and/or kV according to patient size and/or use of iterative reconstruction technique. CONTRAST:  75mL OMNIPAQUE IOHEXOL 300 MG/ML  SOLN COMPARISON:  None Available. FINDINGS: Pharynx and larynx: Oral cavity within normal limits. Asymmetric enlargement of the left palatine tonsil, consistent with acute tonsillitis.  Superimposed rim enhancing hypodense collection measuring 1.6 x 2.2 x 1.2 cm, consistent with tonsillar/peritonsillar abscess (series 3, image 34). Associated hazy inflammatory stranding within the adjacent left parapharyngeal and submandibular spaces. Mucosal edema within the adjacent left pharynx, compatible with associated pharyngitis.  Epiglottis itself within normal limits. No retropharyngeal collection. Supraglottic airway remains patent. Negative glottis. Subglottic airway patent clear. Salivary glands: Salivary glands including the parotid and submandibular glands are within normal limits. Thyroid: Normal. Lymph nodes: Asymmetric prominence of left upper cervical lymph nodes, largest of which measures 8 mm in short axis at level 2, presumably reactive. Vascular: Normal intravascular enhancement seen within the neck. Limited intracranial: Unremarkable. Visualized orbits: Unremarkable. Mastoids and visualized paranasal sinuses: Mild mucosal thickening noted about the floor of the left maxillary sinus. Visualized paranasal sinuses are otherwise clear. Visualized mastoids and middle ear cavities are well pneumatized and free of fluid. Skeleton: No worrisome osseous lesions. Chronic anterior endplate wedging noted at the superior endplate of T1. Mild spondylosis present at C5-6. Upper chest: No other acute finding. Other: None. IMPRESSION: 1. Findings consistent with acute left-sided tonsillitis/pharyngitis, with superimposed 1.6 x 2.2 x 1.2 cm tonsillar/peritonsillar abscess. 2. Asymmetric prominence of left upper cervical lymph nodes, presumably reactive. Electronically Signed   By: Virgia Griffins M.D.   On: 07/01/2023 00:52    Procedures Procedures    Medications Ordered in ED Medications  sodium chloride  0.9 % bolus 1,000 mL (has no administration in time range)  dexamethasone (DECADRON) injection 10 mg (10 mg Intravenous Given 07/01/23 0016)  Ampicillin-Sulbactam (UNASYN) 3 g in sodium chloride  0.9 % 100 mL IVPB (0 g Intravenous Stopped 07/01/23 0127)  iohexol (OMNIPAQUE) 300 MG/ML solution 100 mL (75 mLs Intravenous Contrast Given 07/01/23 0030)  ketorolac (TORADOL) 15 MG/ML injection 15 mg (15 mg Intravenous Given 07/01/23 0127)    ED Course/ Medical Decision Making/ A&P Clinical Course as of 07/01/23 0220  Sun Jul 01, 2023  0006 I have a strong suspicion the patient has a left peritonsillar abscess IV antibiotics and Decadron have been ordered Plan for CT imaging [DW]  0118 Findings consistent with peritonsillar abscess on CT imaging However airway is widely patent.  On exam patient is awake and alert, no acute distress, no stridor or drooling. [DW]  0118 Discussed with Dr. Virgia Griffins with otolaryngology and plans for patient to follow-up as an outpatient in a little over 24 hours in their ENT clinic Patient is not requiring emergent incision and drainage at this time [DW]  0219 Overall patient is improved, handling his secretions during drinking fluids  Patient feels comfortable going home, as I did offer him transfer for emergent I&D but he defers at this time  We discussed strict return precautions [DW]    Clinical Course User Index [DW] Eldon Greenland, MD                                 Medical Decision Making Amount and/or Complexity of Data Reviewed Radiology: ordered.  Risk Prescription drug management.   This patient presents to the ED for concern of sore throat, this involves an extensive number of treatment options, and is a complaint that carries with it a high risk of complications and morbidity.  The differential diagnosis includes but is not limited to strep pharyngitis, viral pharyngitis, mononucleosis, retropharyngeal abscess, peritonsillar abscess, epiglottitis  Social Determinants of Health: Patient's lack of insurance  increases the complexity of managing their presentation  Additional history obtained: Additional history obtained from family  Imaging Studies ordered: I ordered imaging studies including CT scan soft tissue neck   I independently visualized and interpreted imaging which showed left peritonsillar abscess I agree with the radiologist interpretation  Medicines ordered and prescription drug management: I ordered medication including IV antibiotics and Decadron  for presumed peritonsillar abscess Reevaluation of the patient after these medicines showed that the patient    stayed the same   Critical Interventions:   IV antibiotics  Consultations Obtained: I requested consultation with the consultant otolaryngology , and discussed  findings as well as pertinent plan - they recommend: Follow-up in around 24 hours  Reevaluation: After the interventions noted above, I reevaluated the patient and found that they have :stayed the same  Complexity of problems addressed: Patient's presentation is most consistent with  acute presentation with potential threat to life or bodily function  Disposition: After consideration of the diagnostic results and the patient's response to treatment,  I feel that the patent would benefit from discharge   .           Final Clinical Impression(s) / ED Diagnoses Final diagnoses:  Peritonsillar abscess    Rx / DC Orders ED Discharge Orders          Ordered    amoxicillin-clavulanate (AUGMENTIN) 400-57 MG/5ML suspension  2 times daily        07/01/23 0118    HYDROcodone-acetaminophen  (HYCET) 7.5-325 mg/15 ml solution  Every 6 hours PRN        07/01/23 0118              Eldon Greenland, MD 07/01/23 1610

## 2023-07-02 ENCOUNTER — Emergency Department (HOSPITAL_BASED_OUTPATIENT_CLINIC_OR_DEPARTMENT_OTHER)
Admission: EM | Admit: 2023-07-02 | Discharge: 2023-07-02 | Disposition: A | Discharge status: Home / Self Care | Attending: Emergency Medicine | Admitting: Emergency Medicine

## 2023-07-02 ENCOUNTER — Encounter (HOSPITAL_BASED_OUTPATIENT_CLINIC_OR_DEPARTMENT_OTHER): Payer: Self-pay

## 2023-07-02 ENCOUNTER — Other Ambulatory Visit: Payer: Self-pay

## 2023-07-02 ENCOUNTER — Telehealth: Payer: Self-pay | Admitting: Otolaryngology

## 2023-07-02 DIAGNOSIS — J029 Acute pharyngitis, unspecified: Secondary | ICD-10-CM | POA: Diagnosis present

## 2023-07-02 DIAGNOSIS — J36 Peritonsillar abscess: Secondary | ICD-10-CM | POA: Insufficient documentation

## 2023-07-02 MED ORDER — MORPHINE SULFATE (PF) 4 MG/ML IV SOLN
4.0000 mg | Freq: Once | INTRAVENOUS | Status: AC
  Administered 2023-07-02: 4 mg via INTRAVENOUS
  Filled 2023-07-02: qty 1

## 2023-07-02 MED ORDER — LACTATED RINGERS IV BOLUS
1000.0000 mL | Freq: Once | INTRAVENOUS | Status: AC
  Administered 2023-07-02: 1000 mL via INTRAVENOUS

## 2023-07-02 MED ORDER — ONDANSETRON HCL 4 MG/2ML IJ SOLN
4.0000 mg | Freq: Once | INTRAMUSCULAR | Status: AC
  Administered 2023-07-02: 4 mg via INTRAVENOUS
  Filled 2023-07-02: qty 2

## 2023-07-02 MED ORDER — DEXAMETHASONE SODIUM PHOSPHATE 10 MG/ML IJ SOLN
6.0000 mg | Freq: Once | INTRAMUSCULAR | Status: AC
  Administered 2023-07-02: 6 mg via INTRAVENOUS
  Filled 2023-07-02: qty 1

## 2023-07-02 NOTE — ED Notes (Signed)
Dr. Floyd at bedside. 

## 2023-07-02 NOTE — Telephone Encounter (Signed)
 Patient called and left a VM to schedule an appointment to be seen for ED F/U for an abscess in throat, please call and schedule

## 2023-07-02 NOTE — ED Triage Notes (Signed)
 In for further eval of swollen and sore throat since Saturday. Currently taking antibiotics and was to follow up with ENT today but office is closed till noon due to construction.

## 2023-07-02 NOTE — ED Triage Notes (Addendum)
 Dr Virgia Griffins called and enroute!

## 2023-07-02 NOTE — ED Provider Notes (Signed)
 I assumed patient care on arrival from [facility].  Patient sent with PTA, plan for ENT eval.   Patient evaluated, muffled voice, unable to swallow secretions feels pain is well controlled.    Albertus Hughs, DO 07/02/23 1212

## 2023-07-02 NOTE — Consult Note (Signed)
 Reason for Consult:pta Referring Physician: Dr Ector Goltz Lawrence Stewart is an 28 y.o. male.  HPI: hx of 1-2 days of left sore throat. He is still able to swallow and has increased sore throat. He was seen in ER and CT scan shows left PTA. He has no previous hx.  History reviewed. No pertinent past medical history.  Past Surgical History:  Procedure Laterality Date   WISDOM TOOTH EXTRACTION      History reviewed. No pertinent family history.  Social History:  reports that he has been smoking cigarettes. He has never used smokeless tobacco. He reports current alcohol use. He reports that he does not use drugs.  Allergies: No Known Allergies  Medications: I have reviewed the patient's current medications.  Results for orders placed or performed during the hospital encounter of 07/01/23 (from the past 48 hours)  Group A Strep by PCR     Status: None   Collection Time: 06/30/23 11:43 PM   Specimen: Throat; Sterile Swab  Result Value Ref Range   Group A Strep by PCR NOT DETECTED NOT DETECTED    Comment: Performed at [facility], [address], [place], [place] [address]    CT Soft Tissue Neck W Contrast Result Date: 07/01/2023 CLINICAL DATA:  Initial evaluation for acute sore throat, suspected epiglottitis or tonsillitis. EXAM: CT NECK WITH CONTRAST TECHNIQUE: Multidetector CT imaging of the neck was performed using the standard protocol following the bolus administration of intravenous contrast. RADIATION DOSE REDUCTION: This exam was performed according to the departmental dose-optimization program which includes automated exposure control, adjustment of the mA and/or kV according to patient size and/or use of iterative reconstruction technique. CONTRAST:  75mL OMNIPAQUE IOHEXOL 300 MG/ML  SOLN COMPARISON:  None Available. FINDINGS: Pharynx and larynx: Oral cavity within normal limits. Asymmetric enlargement of the left palatine tonsil, consistent with acute  tonsillitis. Superimposed rim enhancing hypodense collection measuring 1.6 x 2.2 x 1.2 cm, consistent with tonsillar/peritonsillar abscess (series 3, image 34). Associated hazy inflammatory stranding within the adjacent left parapharyngeal and submandibular spaces. Mucosal edema within the adjacent left pharynx, compatible with associated pharyngitis. Epiglottis itself within normal limits. No retropharyngeal collection. Supraglottic airway remains patent. Negative glottis. Subglottic airway patent clear. Salivary glands: Salivary glands including the parotid and submandibular glands are within normal limits. Thyroid: Normal. Lymph nodes: Asymmetric prominence of left upper cervical lymph nodes, largest of which measures 8 mm in short axis at level 2, presumably reactive. Vascular: Normal intravascular enhancement seen within the neck. Limited intracranial: Unremarkable. Visualized orbits: Unremarkable. Mastoids and visualized paranasal sinuses: Mild mucosal thickening noted about the floor of the left maxillary sinus. Visualized paranasal sinuses are otherwise clear. Visualized mastoids and middle ear cavities are well pneumatized and free of fluid. Skeleton: No worrisome osseous lesions. Chronic anterior endplate wedging noted at the superior endplate of T1. Mild spondylosis present at C5-6. Upper chest: No other acute finding. Other: None. IMPRESSION: 1. Findings consistent with acute left-sided tonsillitis/pharyngitis, with superimposed 1.6 x 2.2 x 1.2 cm tonsillar/peritonsillar abscess. 2. Asymmetric prominence of left upper cervical lymph nodes, presumably reactive. Electronically Signed   By: Virgia Griffins M.D.   On: 07/01/2023 00:52    ROS Blood pressure 116/67, pulse 60, temperature 98.3 F (36.8 C), resp. rate 14, height 5\' 9"  (1.753 m), weight 72.6 kg, SpO2 100%. Physical Exam Constitutional:      Appearance: He is well-developed.  HENT:     Right Ear: Ear canal normal.     Left Ear:  Ear  canal normal.     Mouth/Throat:     Comments: Uvula slightly edematous. The left soft palate is bulging and erythema. Opens mouth normal;. No breathing problem.  Neurological:     Mental Status: He is alert.       Assessment/Plan: Left PTA- Pt now needs I/D as he has failed medical therapy. We discussed I/D and risks, benefits and options,. All questions answered and consent obtained. The OP sprayed with hurricane and injected the peritonsillar area with 1% lido with epi 2 cc. An incision made in the left  peritonsillar area and tonsil hemastat opened the space with pus expressed. Significant pus was expressed. Minimal bleeding and he tolerated it well. He will continue his antibiotics and f/u in 2 weeks. He should be almost completely better in 2-3 days and if not return sooner.    Lawrence Stewart 07/02/2023, 11:43 AM

## 2023-07-02 NOTE — ED Notes (Signed)
 IV secured. VS updated. Patient sent POV with family to [facility] for [facility].

## 2023-07-02 NOTE — ED Notes (Signed)
 Report sent to [facility] via secure chat. She acknowledged. No questions at this time.

## 2023-07-02 NOTE — ED Provider Notes (Signed)
 [place] EMERGENCY DEPARTMENT AT [facility] Provider Note   CSN: [id number] Arrival date & time: 07/02/23  0741     History  Chief Complaint  Patient presents with   Sore Throat    Lawrence Stewart is a 28 y.o. male.  Pt was seen in ED a day ago with sore throat, diagnosed with peritonsillar abscess - consultation with ENT then recommended follow up today in office. Pt/family indicate they tried to call office this AM and just got message office was closed. Pt c/o persistent left sided throat pain, painful swallowing. Is able to swallow. No trouble breathing, no cough or sob. No fever. Has been taking antibiotic at home, and took a dose this AM just pta.   The history is provided by the patient, a relative and medical records.  Sore Throat Pertinent negatives include no headaches and no shortness of breath.       Home Medications Prior to Admission medications   Medication Sig Start Date End Date Taking? Authorizing Provider  amoxicillin-clavulanate (AUGMENTIN) 400-57 MG/5ML suspension Take 10.9 mLs (875 mg total) by mouth 2 (two) times daily for 7 days. 07/01/23 07/08/23  Eldon Greenland, MD  HYDROcodone-acetaminophen  (HYCET) 7.5-325 mg/15 ml solution Take 15 mLs by mouth every 6 (six) hours as needed for severe pain (pain score 7-10). 07/01/23 06/30/24  Eldon Greenland, MD  omeprazole  (PRILOSEC) 20 MG capsule Take 1 capsule (20 mg total) by mouth daily. 07/27/21 08/26/21  Billie Budge, MD  ondansetron  (ZOFRAN ) 4 MG tablet Take 1 tablet (4 mg total) by mouth every 8 (eight) hours as needed. 06/19/23 06/18/24  Kandee Orion, MD  ondansetron  (ZOFRAN -ODT) 4 MG disintegrating tablet Take 1 tablet (4 mg total) by mouth every 8 (eight) hours as needed for nausea or vomiting. 07/27/21   Billie Budge, MD  pantoprazole  (PROTONIX ) 40 MG tablet Take 1 tablet (40 mg total) by mouth daily. 04/23/19 04/22/20  Paduchowski, Charee Tumblin, MD      Allergies    Patient has no known allergies.     Review of Systems   Review of Systems  Constitutional:  Negative for fever.  HENT:  Positive for sore throat.   Respiratory:  Negative for cough and shortness of breath.   Gastrointestinal:  Negative for vomiting.  Musculoskeletal:  Negative for neck pain and neck stiffness.  Skin:  Negative for rash.  Neurological:  Negative for headaches.    Physical Exam Updated Vital Signs BP 113/69 (BP Location: Right Arm)   Pulse 69   Temp 98.3 F (36.8 C) (Oral)   Resp 16   Ht 1.753 m (5\' 9" )   Wt 72.6 kg   SpO2 100%   BMI 23.63 kg/m  Physical Exam Vitals and nursing note reviewed.  Constitutional:      Appearance: Normal appearance. He is well-developed.  HENT:     Head: Atraumatic.     Left Ear: Tympanic membrane, ear canal and external ear normal.     Nose: Nose normal.     Mouth/Throat:     Mouth: Mucous membranes are moist.     Pharynx: Oropharynx is clear.     Comments: Left peritonsillar abscess. Uvula midline.  Eyes:     General: No scleral icterus.    Conjunctiva/sclera: Conjunctivae normal.  Neck:     Trachea: No tracheal deviation.     Comments: Trachea midline. No neck stiffness or rigidity.  Cardiovascular:     Rate and Rhythm: Normal rate.     Pulses:  Normal pulses.     Heart sounds:     No gallop.  Pulmonary:     Effort: Pulmonary effort is normal. No accessory muscle usage or respiratory distress.     Breath sounds: Normal breath sounds. No stridor.  Musculoskeletal:        General: No swelling.     Cervical back: Normal range of motion and neck supple. No rigidity.  Skin:    General: Skin is warm and dry.     Findings: No rash.  Neurological:     Mental Status: He is alert.     Comments: Alert, speech clear.   Psychiatric:        Mood and Affect: Mood normal.     ED Results / Procedures / Treatments   Labs (all labs ordered are listed, but only abnormal results are displayed) Labs Reviewed - No data to display  EKG EKG  Interpretation Date/Time:  Monday July 02 2023 07:58:28 EDT Ventricular Rate:  70 PR Interval:  154 QRS Duration:  97 QT Interval:  378 QTC Calculation: 408 R Axis:   93  Text Interpretation: Sinus rhythm Confirmed by Guadalupe Lee ([id number]) on 07/02/2023 8:18:34 AM  Radiology CT Soft Tissue Neck W Contrast Result Date: 07/01/2023 CLINICAL DATA:  Initial evaluation for acute sore throat, suspected epiglottitis or tonsillitis. EXAM: CT NECK WITH CONTRAST TECHNIQUE: Multidetector CT imaging of the neck was performed using the standard protocol following the bolus administration of intravenous contrast. RADIATION DOSE REDUCTION: This exam was performed according to the departmental dose-optimization program which includes automated exposure control, adjustment of the mA and/or kV according to patient size and/or use of iterative reconstruction technique. CONTRAST:  75mL OMNIPAQUE IOHEXOL 300 MG/ML  SOLN COMPARISON:  None Available. FINDINGS: Pharynx and larynx: Oral cavity within normal limits. Asymmetric enlargement of the left palatine tonsil, consistent with acute tonsillitis. Superimposed rim enhancing hypodense collection measuring 1.6 x 2.2 x 1.2 cm, consistent with tonsillar/peritonsillar abscess (series 3, image 34). Associated hazy inflammatory stranding within the adjacent left parapharyngeal and submandibular spaces. Mucosal edema within the adjacent left pharynx, compatible with associated pharyngitis. Epiglottis itself within normal limits. No retropharyngeal collection. Supraglottic airway remains patent. Negative glottis. Subglottic airway patent clear. Salivary glands: Salivary glands including the parotid and submandibular glands are within normal limits. Thyroid: Normal. Lymph nodes: Asymmetric prominence of left upper cervical lymph nodes, largest of which measures 8 mm in short axis at level 2, presumably reactive. Vascular: Normal intravascular enhancement seen within the neck. Limited  intracranial: Unremarkable. Visualized orbits: Unremarkable. Mastoids and visualized paranasal sinuses: Mild mucosal thickening noted about the floor of the left maxillary sinus. Visualized paranasal sinuses are otherwise clear. Visualized mastoids and middle ear cavities are well pneumatized and free of fluid. Skeleton: No worrisome osseous lesions. Chronic anterior endplate wedging noted at the superior endplate of T1. Mild spondylosis present at C5-6. Upper chest: No other acute finding. Other: None. IMPRESSION: 1. Findings consistent with acute left-sided tonsillitis/pharyngitis, with superimposed 1.6 x 2.2 x 1.2 cm tonsillar/peritonsillar abscess. 2. Asymmetric prominence of left upper cervical lymph nodes, presumably reactive. Electronically Signed   By: Virgia Griffins M.D.   On: 07/01/2023 00:52    Procedures Procedures    Medications Ordered in ED Medications  lactated ringers bolus 1,000 mL (1,000 mLs Intravenous New Bag/Given 07/02/23 0814)  morphine (PF) 4 MG/ML injection 4 mg (4 mg Intravenous Given 07/02/23 0814)  ondansetron  (ZOFRAN ) injection 4 mg (4 mg Intravenous Given 07/02/23 0814)  dexamethasone (DECADRON) injection  6 mg (6 mg Intravenous Given 07/02/23 0815)    ED Course/ Medical Decision Making/ A&P                                 Medical Decision Making Problems Addressed: Peritonsillar abscess: acute illness or injury with systemic symptoms that poses a threat to life or bodily functions  Amount and/or Complexity of Data Reviewed Independent Historian:     Details: Family/friend, hx External Data Reviewed: labs, radiology and notes. Labs:  Decision-making details documented in ED Course. Radiology: independent interpretation performed. Decision-making details documented in ED Course. Discussion of management or test interpretation with external provider(s): ENT, Dr Virgia Griffins  Risk Prescription drug management. Parenteral controlled substances.  Reviewed  nursing notes and prior charts for additional history.   Recent labs reviewed/interpreted by me - strep neg.   Recent ct reviewed/interpreted by me - left peritonsillar abscess.   Pt just took dose of abx prior to arrival.  Lr bolus. Morphine iv. Zofran  iv. Decadron iv.   ENT consulted.   ENT re-paged. Discussed pt with Dr Virgia Griffins at [phone] - he indicates sent to [facility] ED so he can see and drain PTA there. Discussed w pt, pt agreeable.   EDP, Ds Trifan/FLoyd aware of patient coming.   Recheck, breathing comfortably, no stridor, handling secretions. Vitals normal.   Pt currently appears stable for  transport.         Final Clinical Impression(s) / ED Diagnoses Final diagnoses:  Peritonsillar abscess    Rx / DC Orders ED Discharge Orders     None         Guadalupe Lee, MD 07/02/23 [phone]

## 2023-07-02 NOTE — Discharge Instructions (Addendum)
 Follow up with ENT in the office.  Continue your antibiotics until completion

## 2023-07-03 ENCOUNTER — Other Ambulatory Visit: Payer: Self-pay

## 2023-07-03 ENCOUNTER — Emergency Department (HOSPITAL_COMMUNITY)
Admission: EM | Admit: 2023-07-03 | Discharge: 2023-07-03 | Discharge status: Against Medical Advice | Attending: Emergency Medicine | Admitting: Emergency Medicine

## 2023-07-03 ENCOUNTER — Encounter (HOSPITAL_COMMUNITY): Payer: Self-pay | Admitting: Emergency Medicine

## 2023-07-03 ENCOUNTER — Telehealth (INDEPENDENT_AMBULATORY_CARE_PROVIDER_SITE_OTHER): Payer: Self-pay | Admitting: Otolaryngology

## 2023-07-03 DIAGNOSIS — Z5321 Procedure and treatment not carried out due to patient leaving prior to being seen by health care provider: Secondary | ICD-10-CM | POA: Diagnosis not present

## 2023-07-03 DIAGNOSIS — J36 Peritonsillar abscess: Secondary | ICD-10-CM | POA: Insufficient documentation

## 2023-07-03 DIAGNOSIS — R110 Nausea: Secondary | ICD-10-CM | POA: Insufficient documentation

## 2023-07-03 NOTE — ED Triage Notes (Signed)
 Pt was here yesterday with a peritonsillar abscess. Pt states it was cut and drained while here and today started to have drainage go down back of throat and he could taste blood and infection causing nausea. Pt states hurts to swallow but that is the same as yesterday. No other pain.

## 2023-07-03 NOTE — Telephone Encounter (Signed)
 Patient called stating his throat is bleeding - was seen in ED 4/28 for PTA. Advised patient to return to ED - per [place], PA.

## 2023-07-03 NOTE — ED Notes (Signed)
 Pt is in ED and placing back on floor.

## 2023-07-03 NOTE — ED Provider Triage Note (Signed)
 Emergency Medicine Provider Triage Evaluation Note  Lawrence Stewart , a 28 y.o. male  was evaluated in triage.  Pt complains of bleeding.  Pt reports he had a peritonsillar abscess drained yesterday and area began bleeding and draining pus today.  Pt describes about a 1/4 cup of blood and pus.  Bleeding has stopped now  Review of Systems  Positive: Bleeding from incision  Negative: fever  Physical Exam  BP 133/81 (BP Location: Right Arm)   Pulse 66   Temp 99.5 F (37.5 C)   Resp 18   SpO2 100%  Gen:   Awake, no distress   Resp:  Normal effort  MSK:   Moves extremities without difficulty  Other:    Medical Decision Making  Medically screening exam initiated at 6:14 PM.  Appropriate orders placed.  Lawrence Stewart was informed that the remainder of the evaluation will be completed by another provider, this initial triage assessment does not replace that evaluation, and the importance of remaining in the ED until their evaluation is complete.     Sandi Crosby, PA-C 07/03/23 1818

## 2023-07-03 NOTE — ED Notes (Signed)
 Was advised by registration that pt left because they could not wait 2 more hours. Took pt OTF.

## 2023-07-10 ENCOUNTER — Institutional Professional Consult (permissible substitution) (INDEPENDENT_AMBULATORY_CARE_PROVIDER_SITE_OTHER)
